# Patient Record
Sex: Male | Born: 1960 | Race: Black or African American | Hispanic: No | Marital: Single | Smoking: Never smoker
Health system: Southern US, Community
[De-identification: ages and names within clinical notes are randomized; demographics above are authoritative.]

## PROBLEM LIST (undated history)

## (undated) DIAGNOSIS — F428 Other obsessive-compulsive disorder: Secondary | ICD-10-CM

## (undated) DIAGNOSIS — F79 Unspecified intellectual disabilities: Secondary | ICD-10-CM

## (undated) DIAGNOSIS — L280 Lichen simplex chronicus: Secondary | ICD-10-CM

## (undated) DIAGNOSIS — E039 Hypothyroidism, unspecified: Secondary | ICD-10-CM

## (undated) DIAGNOSIS — E119 Type 2 diabetes mellitus without complications: Secondary | ICD-10-CM

## (undated) HISTORY — DX: Unspecified intellectual disabilities: F79

## (undated) HISTORY — DX: Type 2 diabetes mellitus without complications: E11.9

## (undated) HISTORY — DX: Hypothyroidism, unspecified: E03.9

## (undated) HISTORY — DX: Lichen simplex chronicus: L28.0

## (undated) HISTORY — DX: Other obsessive-compulsive disorder: F42.8

---

## 2002-05-20 HISTORY — PX: ESOPHAGOGASTRODUODENOSCOPY: SHX1529

## 2003-03-29 ENCOUNTER — Ambulatory Visit (HOSPITAL_COMMUNITY): Admission: RE | Admit: 2003-03-29 | Discharge: 2003-03-29 | Payer: Self-pay | Admitting: Internal Medicine

## 2003-04-01 ENCOUNTER — Ambulatory Visit (HOSPITAL_COMMUNITY): Admission: RE | Admit: 2003-04-01 | Discharge: 2003-04-01 | Payer: Self-pay | Admitting: Internal Medicine

## 2009-11-22 ENCOUNTER — Emergency Department (HOSPITAL_COMMUNITY)
Admission: EM | Admit: 2009-11-22 | Discharge: 2009-11-22 | Payer: Self-pay | Source: Home / Self Care | Admitting: Emergency Medicine

## 2010-03-12 ENCOUNTER — Emergency Department (HOSPITAL_COMMUNITY): Admission: EM | Admit: 2010-03-12 | Discharge: 2010-03-12 | Payer: Self-pay | Admitting: Emergency Medicine

## 2010-08-01 LAB — URINALYSIS, ROUTINE W REFLEX MICROSCOPIC
Bilirubin Urine: NEGATIVE
Glucose, UA: NEGATIVE mg/dL
Hgb urine dipstick: NEGATIVE
Nitrite: NEGATIVE
Protein, ur: NEGATIVE mg/dL
Specific Gravity, Urine: 1.021 (ref 1.005–1.030)
Urobilinogen, UA: 1 mg/dL (ref 0.0–1.0)
pH: 7.5 (ref 5.0–8.0)

## 2010-08-01 LAB — CBC
HCT: 36.8 % — ABNORMAL LOW (ref 39.0–52.0)
Hemoglobin: 12 g/dL — ABNORMAL LOW (ref 13.0–17.0)
MCH: 28.3 pg (ref 26.0–34.0)
MCHC: 32.5 g/dL (ref 30.0–36.0)
MCV: 87.1 fL (ref 78.0–100.0)
Platelets: 225 10*3/uL (ref 150–400)
RBC: 4.22 MIL/uL (ref 4.22–5.81)
RDW: 13.9 % (ref 11.5–15.5)
WBC: 8.3 10*3/uL (ref 4.0–10.5)

## 2010-08-01 LAB — POCT I-STAT, CHEM 8
BUN: 19 mg/dL (ref 6–23)
Calcium, Ion: 1.28 mmol/L (ref 1.12–1.32)
Chloride: 105 mEq/L (ref 96–112)
Creatinine, Ser: 1 mg/dL (ref 0.4–1.5)
Glucose, Bld: 92 mg/dL (ref 70–99)
HCT: 40 % (ref 39.0–52.0)
Hemoglobin: 13.6 g/dL (ref 13.0–17.0)
Potassium: 4.3 mEq/L (ref 3.5–5.1)
Sodium: 141 mEq/L (ref 135–145)
TCO2: 29 mmol/L (ref 0–100)

## 2010-08-01 LAB — DIFFERENTIAL
Basophils Absolute: 0 10*3/uL (ref 0.0–0.1)
Basophils Relative: 0 % (ref 0–1)
Eosinophils Absolute: 0 10*3/uL (ref 0.0–0.7)
Eosinophils Relative: 0 % (ref 0–5)
Lymphocytes Relative: 21 % (ref 12–46)
Lymphs Abs: 1.7 10*3/uL (ref 0.7–4.0)
Monocytes Absolute: 0.3 10*3/uL (ref 0.1–1.0)
Monocytes Relative: 4 % (ref 3–12)
Neutro Abs: 6.2 10*3/uL (ref 1.7–7.7)
Neutrophils Relative %: 75 % (ref 43–77)

## 2010-08-01 LAB — VALPROIC ACID LEVEL: Valproic Acid Lvl: 50 ug/mL (ref 50.0–100.0)

## 2010-08-01 LAB — AMMONIA: Ammonia: 19 umol/L (ref 11–35)

## 2010-10-05 NOTE — Op Note (Signed)
   NAME:  Timothy Bridges, Timothy Bridges                         ACCOUNT NO.:  [id number]   MEDICAL RECORD NO.:  [id number]                   PATIENT TYPE:  AMB   LOCATION:  DAY                                  FACILITY:  [facility]   PHYSICIAN:  R. Roetta Sessions, M.D.              DATE OF BIRTH:  [birth year]   DATE OF PROCEDURE:  03/29/2003  DATE OF DISCHARGE:                                 OPERATIVE REPORT   PROCEDURE:  Diagnostic esophagogastroduodenoscopy.   INDICATIONS FOR PROCEDURE:  The patient is a 49 year old gentleman, mentally  challenged, with intermittent regurgitation, possible rumination, with no  improvement with Prilosec.  EGD is now being done to further evaluate his  symptoms.  This approach has been discussed with the patient at length.  The  potential risks, benefits, and alternatives have been reviewed and questions  answered.  Please see my 03/07/2003 consultation note for more information.   PROCEDURE:  O2 saturation, blood pressure, pulses, and respirations were  monitored throughout the entire procedure.  Conscious sedation was with  Versed 5 mg IV, Demerol 100 mg IV in divided doses, Cetacaine spray for  topical oropharyngeal anesthesia.  The instrument used was the Olympus video  chip adult gastroscope.   FINDINGS:  Examination of the tubular esophagus revealed no mucosal  abnormalities.  The EG junction was easily traversed.   Stomach:  The gastric cavity was empty and insufflated well with air.  Thorough examination of the gastric mucosa including retroflex view in the  proximal stomach and esophagogastric junction demonstrated no abnormalities.  The pylorus was patent and easily traversed.   Duodenum:  The bulb and second portion appeared normal.   THERAPEUTIC/DIAGNOSTIC MANEUVERS PERFORMED:  None.   The patient tolerated the procedure well and was reactive in endoscopy.   IMPRESSION:  Normal esophagus, stomach, and first and second portions of the  duodenum.   RECOMMENDATIONS:  1. Will go ahead and proceed with a solid phase nuclear gastric imaging     study given his history of diabetes to rule     out delayed gastric emptying.  2. He is to continue Prilosec 20 mg orally daily for the time being.  3. Further recommendations to follow.      ___________________________________________                                            [name], M.D.   RMR/MEDQ  D:  03/29/2003  T:  03/29/2003  Job:  [id number]   cc:   Wyvonnia Lora  [address]  [place]  [place] [address]  Fax: [phone]

## 2010-10-05 NOTE — Consult Note (Signed)
NAME:  Timothy Bridges, Timothy Bridges NO.:  [id number]   MEDICAL RECORD NO.:  [id number]                  PATIENT TYPE:   LOCATION:                                       FACILITY:   PHYSICIAN:  R. Roetta Sessions, M.D.              DATE OF BIRTH:  [birth year]   DATE OF CONSULTATION:  03/07/2003  DATE OF DISCHARGE:                                   CONSULTATION   PRIMARY CARE PHYSICIAN:  Dr. Wyvonnia Lora in [place], [place].   REASON FOR CONSULTATION:  Nausea and vomiting, weight loss.   HISTORY OF PRESENT ILLNESS:  Timothy Bridges is a 50 year old African-  American male, per family mentally challenged, resident of a group home sent  over by Dr. Margo Common to further evaluate a three-year history of intermittent  nausea and vomiting.  According to his caregiver he typically eats a meal  and comes away from the table, and then some several minutes afterward  starts bringing up his food effortlessly.  He has not really complained  about any obvious typical reflux symptoms, has not had any abdominal pain,  and bowel function maintained at one to two formed bowel movements a day  with no melena, no rectal bleeding, no constipation or diarrhea noted.  He  has lost approximately 20 pounds in the past year.  He does not display this  behavior all the time; however, it is fairly routine and seen on a regular  basis and has been present for the past three years.  Apparently he was on a  course of Reglan briefly which did not help.  He has been started on  Prilosec 20 mg orally daily which seems to have diminished some of these  episodes.  Again, there is no heaving.  This sounds like effortless  regurgitation.  He has not had any apparent esophageal dysphagia or  odynophagia.  He has not had any radiographic or endoscopy studies.   PAST MEDICAL HISTORY:  As above.  History of type 2 diabetes mellitus,  schizoaffective disorder, mental retardation.   PAST SURGERY:   None.   CURRENT MEDICATIONS:  1. Levothyroxine 0.05 mg daily.  2. Depakote 250 mg b.i.d.  3. Prilosec 20 mg daily.  4. Zyprexa 10 mg b.i.d.  5. __________ cream p.r.n.  6. __________ 25 mg daily.  7. HCL 25 mg b.i.d.   ALLERGIES:  No known drug allergies.   FAMILY HISTORY:  Mother is deceased.  Father deceased; he had kidney stones.  No history of GI or liver illness.   SOCIAL HISTORY:  The patient is single, no children.  No tobacco, no  alcohol.  Group home resident.   PHYSICAL EXAMINATION:  GENERAL:  Reveals a 50 year old gentleman accompanied  by his caregiver who has quite a bit of rhythmic arm movements.  VITAL SIGNS:  Weight 164, temperature 98.7, blood pressure 110/78, pulse 76.  SKIN:  Warm  and dry.  HEENT:  No sclerae icterus.  Conjunctivae are pink.  JVD is not prominent.  CHEST:  Lungs are clear to auscultation.  CARDIAC:  Regular rate and rhythm without murmur, gallop, or rub.  ABDOMEN:  Nondistended, positive bowel sounds, soft, nontender, without  appreciable mass or organomegaly.   ASSESSMENT:  Timothy Bridges is a 50 year old gentleman who is mentally  challenged; it is difficulty to track the history.  According to the  caregiver he is displaying regurgitation and possibly rumination syndrome.  I really do not get a history of frank nausea and vomiting.  He may have  significant gastroesophageal reflux disease.  I do get a sense that a course  of acid suppression therapy with Prilosec did ameliorate some of his  symptoms.  He has lost weight but that apparently has stabilized.  He really  needs to have imaging of his upper GI tract.  I have offered Mr. Timothy Bridges an  EGD.  I have discussed this with his caregiver.  Hopefully we can do this  under conscious sedation.  Risks, benefits, and alternatives have been  reviewed.  Will plan EGD in the near future at [facility].  Further  recommendations to follow.   I would like to thank Dr. Wyvonnia Lora for  allowing me to see this nice  gentleman today.      ___________________________________________                                            [name], M.D.   RMR/MEDQ  D:  03/07/2003  T:  03/07/2003  Job:  [id number]   cc:   Wyvonnia Lora  [address]  [place]  [place] [address]  Fax: [phone]

## 2011-01-16 ENCOUNTER — Ambulatory Visit (INDEPENDENT_AMBULATORY_CARE_PROVIDER_SITE_OTHER): Payer: Medicare Other | Admitting: Gastroenterology

## 2011-01-16 ENCOUNTER — Encounter: Payer: Self-pay | Admitting: Gastroenterology

## 2011-01-16 VITALS — BP 105/68 | HR 91 | Temp 97.2°F | Ht 71.0 in | Wt 146.0 lb

## 2011-01-16 DIAGNOSIS — R1110 Vomiting, unspecified: Secondary | ICD-10-CM

## 2011-01-16 DIAGNOSIS — IMO0001 Reserved for inherently not codable concepts without codable children: Secondary | ICD-10-CM | POA: Insufficient documentation

## 2011-01-16 DIAGNOSIS — F428 Other obsessive-compulsive disorder: Secondary | ICD-10-CM | POA: Insufficient documentation

## 2011-01-16 DIAGNOSIS — F9821 Rumination disorder of infancy: Secondary | ICD-10-CM

## 2011-01-16 DIAGNOSIS — R634 Abnormal weight loss: Secondary | ICD-10-CM | POA: Insufficient documentation
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
## 2011-01-18 ENCOUNTER — Encounter: Payer: Self-pay | Admitting: Gastroenterology

## 2011-01-18 NOTE — Assessment & Plan Note (Signed)
Abnormal weight loss. History of psychogenic rumination syndrome. According to group home staff, episodes have become more frequent and persistent. No symptoms of abdominal pain, diarrhea, blood in the stool. He has mild normocytic anemia as well. Difficult to evaluate the patient given his mental retardation, psychiatric issues.  Recommend colonoscopy given the age, weight loss, and anemia. Also recommend EGD for ongoing regurgitation and weight loss to rule out structural abnormality.  According to group home staff, patient has healthcare power of attorney, cousin, who will provide consent.

## 2011-01-22 LAB — HEMOGLOBIN A1C
ALT: 12 U/L (ref 10–40)
AST: 14 U/L
Albumin: 3.7
Alkaline Phosphatase: 42 U/L
BUN: 15 mg/dL (ref 4–21)
Calcium: 9.8 mg/dL (ref 8.7–10.7)
Creat: 0.83
Glucose: 84
HCT: 36 %
Hemoglobin: 11.6 g/dL — AB (ref 13.5–17.5)
Hgb A1c MFr Bld: 5.6 % (ref 4.0–6.0)
MCV: 88.5 fL
Potassium: 3.9 mmol/L
Sodium: 139 mmol/L (ref 137–147)
TSH: 0.63
Total bilirubin, fluid: 0.3
Valproic Acid Lvl: 45
platelet count: 197

## 2011-01-22 NOTE — Progress Notes (Signed)
Cc to PCP 

## 2011-01-25 ENCOUNTER — Encounter (HOSPITAL_COMMUNITY)
Admission: RE | Admit: 2011-01-25 | Discharge: 2011-01-25 | Disposition: A | Discharge status: Home / Self Care | Payer: Medicaid Other | Source: Ambulatory Visit | Attending: Internal Medicine | Admitting: Internal Medicine

## 2011-01-25 NOTE — Progress Notes (Signed)
Pt. Arrived for pre-op interview.  Pt. Is non-verbal & arrived with aide from group home.  Aide stated that the guardian/POA was not going to be present during interview process or the day of procedure.  I called [facility] & spoke with coordinator Sharalyn Ink & she stated that they didn't have legal papers stating that the pt.'s cousin was the POA.  The coordinator faxed over papers stating that they could sign in her place but the papers are not legal.  Dr. Luvenia Starch office notified of situation.  Spoke with Crystal & stated that either the POA had to be present or legal papers were needed stating guardianship.  Tried to call POA Ampsey Hyacinth Meeker & was unable to contact her.

## 2011-01-25 NOTE — Patient Instructions (Signed)
21 Timothy Bridges  01/25/2011   Your procedure is scheduled on:  01/31/11  [facility] to [facility] [place] [facility].  Call this number if you have problems the morning of surgery: [phone]   Remember:   Do not eat food:After Midnight.  Do not drink clear liquids: After Midnight.  Take these medicines the morning of surgery with A SIP OF WATER: depakote, vistaril, synthroid, reglan, & prilosec.   Do not wear jewelry, make-up or nail polish.  Do not wear lotions, powders, or perfumes. You may wear deodorant.  Do not shave 48 hours prior to surgery.  Do not bring valuables to the hospital.  Contacts, dentures or bridgework may not be worn into surgery.  Leave suitcase in the car. After surgery it may be brought to your room.  For patients admitted to the hospital, checkout time is 11:00 AM the day of discharge.   Patients discharged the day of surgery will not be allowed to drive home.  Name and phone number of your driver: family  Special Instructions: N/A   Please read over the following fact sheets that you were given: Pain Booklet, Anesthesia Post-op Instructions and Care and Recovery After Surgery   PATIENT INSTRUCTIONS POST-ANESTHESIA  IMMEDIATELY FOLLOWING SURGERY:  Do not drive or operate machinery for the first twenty four hours after surgery.  Do not make any important decisions for twenty four hours after surgery or while taking narcotic pain medications or sedatives.  If you develop intractable nausea and vomiting or a severe headache please notify your doctor immediately.  FOLLOW-UP:  Please make an appointment with your surgeon as instructed. You do not need to follow up with anesthesia unless specifically instructed to do so.  WOUND CARE INSTRUCTIONS (if applicable):  Keep a dry clean dressing on the anesthesia/puncture wound site if there is drainage.  Once the wound has quit draining you may leave it open to air.  Generally you should leave the bandage intact for twenty four  hours unless there is drainage.  If the epidural site drains for more than 36-48 hours please call the anesthesia department.  QUESTIONS?:  Please feel free to call your physician or the hospital operator if you have any questions, and they will be happy to assist you.     [facility] Anesthesia Department [address] [place] [phone]

## 2011-01-31 ENCOUNTER — Encounter (HOSPITAL_COMMUNITY): Admission: RE | Payer: Self-pay | Source: Ambulatory Visit

## 2011-01-31 ENCOUNTER — Ambulatory Visit (HOSPITAL_COMMUNITY): Admission: RE | Admit: 2011-01-31 | Payer: Medicaid Other | Source: Ambulatory Visit | Admitting: Internal Medicine

## 2011-01-31 ENCOUNTER — Encounter (HOSPITAL_COMMUNITY): Payer: Self-pay | Admitting: Anesthesiology

## 2011-01-31 SURGERY — COLONOSCOPY WITH PROPOFOL
Anesthesia: Monitor Anesthesia Care

## 2011-01-31 NOTE — Anesthesia Preprocedure Evaluation (Deleted)
Anesthesia Evaluation Anesthesia Physical Anesthesia Plan Anesthesia Quick Evaluation  

## 2011-02-28 ENCOUNTER — Ambulatory Visit (INDEPENDENT_AMBULATORY_CARE_PROVIDER_SITE_OTHER): Payer: Medicaid Other | Admitting: Gastroenterology

## 2011-02-28 ENCOUNTER — Encounter: Payer: Self-pay | Admitting: Gastroenterology

## 2011-02-28 VITALS — BP 115/81 | HR 94 | Temp 97.6°F | Ht 72.0 in | Wt 150.8 lb

## 2011-02-28 DIAGNOSIS — R634 Abnormal weight loss: Secondary | ICD-10-CM

## 2011-02-28 DIAGNOSIS — F9821 Rumination disorder of infancy: Secondary | ICD-10-CM

## 2011-02-28 DIAGNOSIS — F428 Other obsessive-compulsive disorder: Secondary | ICD-10-CM

## 2011-02-28 DIAGNOSIS — R1110 Vomiting, unspecified: Secondary | ICD-10-CM
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
## 2011-02-28 NOTE — Patient Instructions (Signed)
We have set you up for a look at your upper and lower GI tract.  It is very important that your power of attorney be present for this procedure.

## 2011-03-04 NOTE — Assessment & Plan Note (Signed)
50 year old male, mentally-challenged, with abnormal wt loss documented in the setting of psychogenic rumination syndrome and mild normocytic anemia. No other symptoms such as abdominal pain, change in bowel habits, or rectal bleeding. Due to pt's mental status, may be difficult to fully appreciate clinical picture. Due to wt loss, anemia, will proceed with colonoscopy as well as EGD for continued regurgitation/wt loss.   Pt's cousin is the power of attorney, who was not present at time of last scheduled procedure a few weeks ago. This had to be cancelled. I discussed with the aide today the importance of scheduling this in conjuction with the POA's schedule.   Will be done in the OR due to polypharmacy.   Proceed with EGD and TCS with Dr. Jena Gauss in near future: the risks, benefits, and alternatives have been discussed with the patient in detail. The patient states understanding and desires to proceed.

## 2011-03-06 NOTE — Progress Notes (Signed)
Cc to PCP 

## 2011-03-21 ENCOUNTER — Inpatient Hospital Stay (HOSPITAL_COMMUNITY): Admission: RE | Admit: 2011-03-21 | Payer: Medicaid Other | Source: Ambulatory Visit

## 2011-03-22 ENCOUNTER — Encounter (HOSPITAL_COMMUNITY): Payer: Self-pay | Admitting: Pharmacy Technician

## 2011-03-22 ENCOUNTER — Encounter (HOSPITAL_COMMUNITY): Payer: Self-pay

## 2011-03-22 ENCOUNTER — Encounter (HOSPITAL_COMMUNITY)
Admission: RE | Admit: 2011-03-22 | Discharge: 2011-03-22 | Disposition: A | Discharge status: Home / Self Care | Payer: Medicare Other | Source: Ambulatory Visit | Attending: Internal Medicine | Admitting: Internal Medicine

## 2011-03-22 ENCOUNTER — Other Ambulatory Visit: Payer: Self-pay

## 2011-03-22 LAB — CBC
HCT: 37.9 % — ABNORMAL LOW (ref 39.0–52.0)
MCHC: 32.2 g/dL (ref 30.0–36.0)
Platelets: 234 10*3/uL (ref 150–400)
RDW: 13.7 % (ref 11.5–15.5)
WBC: 4.7 10*3/uL (ref 4.0–10.5)

## 2011-03-22 LAB — BASIC METABOLIC PANEL
BUN: 15 mg/dL (ref 6–23)
Creatinine, Ser: 0.94 mg/dL (ref 0.50–1.35)
GFR calc Af Amer: >90 mL/min (ref >90)
GFR calc non Af Amer: >90 mL/min (ref >90)
Potassium: 4.3 mEq/L (ref 3.5–5.1)

## 2011-03-22 NOTE — Patient Instructions (Addendum)
21 Timothy Bridges  03/22/2011   Your procedure is scheduled on:  03/28/2011  Report to [facility] at  900  AM.  Call this number if you have problems the morning of surgery: [phone]   Remember:   Do not eat food:After Midnight.  Do not drink clear liquids: After Midnight.  Take these medicines the morning of surgery with A SIP OF WATER:  Depakote,vistaril,synthroid,reglan,prilosec   Do not wear jewelry, make-up or nail polish.  Do not wear lotions, powders, or perfumes. You may wear deodorant.  Do not shave 48 hours prior to surgery.  Do not bring valuables to the hospital.  Contacts, dentures or bridgework may not be worn into surgery.  Leave suitcase in the car. After surgery it may be brought to your room.  For patients admitted to the hospital, checkout time is 11:00 AM the day of discharge.   Patients discharged the day of surgery will not be allowed to drive home.  Name and phone number of your driver: Group home staff  Special Instructions: N/A   Please read over the following fact sheets that you were given: Pain Booklet, Surgical Site Infection Prevention, Anesthesia Post-op Instructions and Care and Recovery After Surgery Colonoscopy A colonoscopy is an exam to evaluate your entire colon. In this exam, your colon is cleansed. A long fiberoptic tube is inserted through your rectum and into your colon. The fiberoptic scope (endoscope) is a long bundle of enclosed and very flexible fibers. These fibers transmit light to the area examined and send images from that area to your caregiver. Discomfort is usually minimal. You may be given a drug to help you sleep (sedative) during or prior to the procedure. This exam helps to detect lumps (tumors), polyps, inflammation, and areas of bleeding. Your caregiver may also take a small piece of tissue (biopsy) that will be examined under a microscope. LET YOUR CAREGIVER KNOW ABOUT:   Allergies to food or medicine.   Medicines taken,  including vitamins, herbs, eyedrops, over-the-counter medicines, and creams.   Use of steroids (by mouth or creams).   Previous problems with anesthetics or numbing medicines.   History of bleeding problems or blood clots.   Previous surgery.   Other health problems, including diabetes and kidney problems.   Possibility of pregnancy, if this applies.  BEFORE THE PROCEDURE   A clear liquid diet may be required for 2 days before the exam.   Ask your caregiver about changing or stopping your regular medications.   Liquid injections (enemas) or laxatives may be required.   A large amount of electrolyte solution may be given to you to drink over a short period of time. This solution is used to clean out your colon.   You should be present 60 minutes prior to your procedure or as directed by your caregiver.  AFTER THE PROCEDURE   If you received a sedative or pain relieving medication, you will need to arrange for someone to drive you home.   Occasionally, there is a little blood passed with the first bowel movement. Do not be concerned.  FINDING OUT THE RESULTS OF YOUR TEST Not all test results are available during your visit. If your test results are not back during the visit, make an appointment with your caregiver to find out the results. Do not assume everything is normal if you have not heard from your caregiver or the medical facility. It is important for you to follow up on all of your test  results. HOME CARE INSTRUCTIONS   It is not unusual to pass moderate amounts of gas and experience mild abdominal cramping following the procedure. This is due to air being used to inflate your colon during the exam. Walking or a warm pack on your belly (abdomen) may help.   You may resume all normal meals and activities after sedatives and medicines have worn off.   Only take over-the-counter or prescription medicines for pain, discomfort, or fever as directed by your caregiver. Do not use  aspirin or blood thinners if a biopsy was taken. Consult your caregiver for medicine usage if biopsies were taken.  SEEK IMMEDIATE MEDICAL CARE IF:   You have a fever.   You pass large blood clots or fill a toilet with blood following the procedure. This may also occur 10 to 14 days following the procedure. This is more likely if a biopsy was taken.   You develop abdominal pain that keeps getting worse and cannot be relieved with medicine.  Document Released: 05/03/2000 Document Revised: 01/16/2011 Document Reviewed: 12/17/2007 [facility] Patient Information 2012 [place], [place].Esophagogastroduodenoscopy This is an endoscopic procedure (a procedure that uses a device like a flexible telescope) that allows your caregiver to view the upper stomach and small bowel. This test allows your caregiver to look at the esophagus. The esophagus carries food from your mouth to your stomach. They can also look at your duodenum. This is the first part of the small intestine that attaches to the stomach. This test is used to detect problems in the bowel such as ulcers and inflammation. PREPARATION FOR TEST Nothing to eat after midnight the day before the test. NORMAL FINDINGS Normal esophagus, stomach, and duodenum. Ranges for normal findings may vary among different laboratories and hospitals. You should always check with your doctor after having lab work or other tests done to discuss the meaning of your test results and whether your values are considered within normal limits. MEANING OF TEST  Your caregiver will go over the test results with you and discuss the importance and meaning of your results, as well as treatment options and the need for additional tests if necessary. OBTAINING THE TEST RESULTS It is your responsibility to obtain your test results. Ask the lab or department performing the test when and how you will get your results. Document Released: 09/06/2004 Document Revised: 01/16/2011 Document  Reviewed: 04/15/2008 [facility] Patient Information 2012 [place], [place].PATIENT INSTRUCTIONS POST-ANESTHESIA  IMMEDIATELY FOLLOWING SURGERY:  Do not drive or operate machinery for the first twenty four hours after surgery.  Do not make any important decisions for twenty four hours after surgery or while taking narcotic pain medications or sedatives.  If you develop intractable nausea and vomiting or a severe headache please notify your doctor immediately.  FOLLOW-UP:  Please make an appointment with your surgeon as instructed. You do not need to follow up with anesthesia unless specifically instructed to do so.  WOUND CARE INSTRUCTIONS (if applicable):  Keep a dry clean dressing on the anesthesia/puncture wound site if there is drainage.  Once the wound has quit draining you may leave it open to air.  Generally you should leave the bandage intact for twenty four hours unless there is drainage.  If the epidural site drains for more than 36-48 hours please call the anesthesia department.  QUESTIONS?:  Please feel free to call your physician or the hospital operator if you have any questions, and they will be happy to assist you.     [facility] Anesthesia Department [address] [place] [phone]

## 2011-03-25 ENCOUNTER — Telehealth: Payer: Self-pay

## 2011-03-25 MED ORDER — BISACODYL 5 MG PO TBEC: DELAYED_RELEASE_TABLET | ORAL | Status: AC

## 2011-03-25 MED ORDER — POLYETHYLENE GLYCOL 3350 17 GM/SCOOP PO POWD: ORAL | Status: DC

## 2011-03-25 NOTE — Telephone Encounter (Signed)
Alisa from group home called- pt will not drink the tri-lyte prep. Asked her if it was the taste or the amount that he had to drink. She said it was the taste, they have no problems getting him to drink. Spoke with AS- ok to try the miralax prep. They will need rx sent to [facility] pharmacy on [address]/ [place]. They will also need new instructions faxed to [phone].

## 2011-03-25 NOTE — Progress Notes (Signed)
Quick Note:  Scheduled for procedure. ______

## 2011-03-25 NOTE — Telephone Encounter (Signed)
Miralax and Dulcolax prescriptions printed out. Need to be faxed.

## 2011-03-26 ENCOUNTER — Encounter: Payer: Self-pay | Admitting: Gastroenterology

## 2011-03-28 ENCOUNTER — Encounter (HOSPITAL_COMMUNITY): Payer: Self-pay | Admitting: *Deleted

## 2011-03-28 ENCOUNTER — Encounter (HOSPITAL_COMMUNITY)
Admission: RE | Disposition: A | Discharge status: Home / Self Care | Payer: Self-pay | Source: Ambulatory Visit | Attending: Internal Medicine

## 2011-03-28 ENCOUNTER — Encounter (HOSPITAL_COMMUNITY): Payer: Self-pay | Admitting: Anesthesiology

## 2011-03-28 ENCOUNTER — Ambulatory Visit (HOSPITAL_COMMUNITY)
Admission: RE | Admit: 2011-03-28 | Discharge: 2011-03-28 | Disposition: A | Discharge status: Home / Self Care | Payer: Medicare Other | Source: Ambulatory Visit | Attending: Internal Medicine | Admitting: Internal Medicine

## 2011-03-28 DIAGNOSIS — R634 Abnormal weight loss: Secondary | ICD-10-CM

## 2011-03-28 DIAGNOSIS — Z1211 Encounter for screening for malignant neoplasm of colon: Secondary | ICD-10-CM | POA: Insufficient documentation

## 2011-03-28 DIAGNOSIS — E119 Type 2 diabetes mellitus without complications: Secondary | ICD-10-CM | POA: Insufficient documentation

## 2011-03-28 DIAGNOSIS — Z01812 Encounter for preprocedural laboratory examination: Secondary | ICD-10-CM | POA: Insufficient documentation

## 2011-03-28 SURGERY — COLONOSCOPY WITH PROPOFOL
Anesthesia: Monitor Anesthesia Care | Site: Throat

## 2011-03-28 MED ORDER — MORPHINE SULFATE 10 MG/ML IJ SOLN
INTRAMUSCULAR | Status: DC | PRN
  Administered 2011-03-28 (×2): 2 mg via INTRAVENOUS

## 2011-03-28 MED ORDER — MIDAZOLAM HCL 2 MG/2ML IJ SOLN
INTRAMUSCULAR | Status: AC
  Administered 2011-03-28: 2 mg via INTRAVENOUS
  Filled 2011-03-28: qty 2

## 2011-03-28 MED ORDER — GLYCOPYRROLATE 0.2 MG/ML IJ SOLN
0.2000 mg | Freq: Once | INTRAMUSCULAR | Status: AC
  Administered 2011-03-28: 0.2 mg via INTRAVENOUS

## 2011-03-28 MED ORDER — LIDOCAINE HCL (PF) 1 % IJ SOLN
INTRAMUSCULAR | Status: AC
  Filled 2011-03-28: qty 5

## 2011-03-28 MED ORDER — ONDANSETRON HCL 4 MG/2ML IJ SOLN
INTRAMUSCULAR | Status: AC
  Administered 2011-03-28: 4 mg via INTRAVENOUS
  Filled 2011-03-28: qty 2

## 2011-03-28 MED ORDER — DIPHENHYDRAMINE HCL 50 MG/ML IJ SOLN
25.0000 mg | Freq: Once | INTRAMUSCULAR | Status: AC
  Administered 2011-03-28: 25 mg via INTRAVENOUS

## 2011-03-28 MED ORDER — MORPHINE SULFATE 10 MG/ML IJ SOLN
INTRAMUSCULAR | Status: AC
  Filled 2011-03-28: qty 1

## 2011-03-28 MED ORDER — PROPOFOL 10 MG/ML IV EMUL
INTRAVENOUS | Status: DC | PRN
  Administered 2011-03-28 (×2): 20 mg via INTRAVENOUS
  Administered 2011-03-28: 10 mg via INTRAVENOUS

## 2011-03-28 MED ORDER — ONDANSETRON HCL 4 MG/2ML IJ SOLN
4.0000 mg | Freq: Once | INTRAMUSCULAR | Status: AC
  Administered 2011-03-28: 4 mg via INTRAVENOUS

## 2011-03-28 MED ORDER — MIDAZOLAM HCL 2 MG/2ML IJ SOLN
INTRAMUSCULAR | Status: AC
  Administered 2011-03-28: 4 mg via INTRAVENOUS
  Filled 2011-03-28: qty 4

## 2011-03-28 MED ORDER — MIDAZOLAM HCL 2 MG/2ML IJ SOLN
1.0000 mg | INTRAMUSCULAR | Status: DC | PRN
  Administered 2011-03-28: 4 mg via INTRAVENOUS

## 2011-03-28 MED ORDER — MIDAZOLAM HCL 2 MG/2ML IJ SOLN
1.0000 mg | INTRAMUSCULAR | Status: AC | PRN
  Administered 2011-03-28 (×3): 2 mg via INTRAVENOUS

## 2011-03-28 MED ORDER — GLYCOPYRROLATE 0.2 MG/ML IJ SOLN
INTRAMUSCULAR | Status: AC
  Administered 2011-03-28: 0.2 mg via INTRAVENOUS
  Filled 2011-03-28: qty 1

## 2011-03-28 MED ORDER — PROPOFOL 10 MG/ML IV EMUL
INTRAVENOUS | Status: AC
  Filled 2011-03-28: qty 20

## 2011-03-28 MED ORDER — MIDAZOLAM HCL 5 MG/5ML IJ SOLN
INTRAMUSCULAR | Status: DC | PRN
  Administered 2011-03-28: 1 mg via INTRAVENOUS

## 2011-03-28 MED ORDER — MIDAZOLAM HCL 5 MG/5ML IJ SOLN
INTRAMUSCULAR | Status: AC
  Administered 2011-03-28: 2 mg via INTRAVENOUS
  Filled 2011-03-28: qty 5

## 2011-03-28 MED ORDER — STERILE WATER FOR IRRIGATION IR SOLN
Status: DC | PRN
  Administered 2011-03-28: 11:00:00

## 2011-03-28 MED ORDER — LACTATED RINGERS IV SOLN
INTRAVENOUS | Status: DC
  Administered 2011-03-28: 1000 mL via INTRAVENOUS
  Administered 2011-03-28: 12:00:00 via INTRAVENOUS

## 2011-03-28 MED ORDER — FENTANYL CITRATE 0.05 MG/ML IJ SOLN: INTRAMUSCULAR | Status: DC | PRN

## 2011-03-28 MED ORDER — BUTAMBEN-TETRACAINE-BENZOCAINE 2-2-14 % EX AERO
1.0000 | INHALATION_SPRAY | Freq: Once | CUTANEOUS | Status: DC
  Filled 2011-03-28: qty 56

## 2011-03-28 MED ORDER — PROPOFOL 10 MG/ML IV EMUL
INTRAVENOUS | Status: DC | PRN
  Administered 2011-03-28 (×2): 75 ug/kg/min via INTRAVENOUS

## 2011-03-28 MED ORDER — DIPHENHYDRAMINE HCL 50 MG/ML IJ SOLN
INTRAMUSCULAR | Status: AC
  Administered 2011-03-28: 25 mg
  Filled 2011-03-28: qty 1

## 2011-03-28 SURGICAL SUPPLY — 26 items
BLOCK BITE 60FR ADLT L/F BLUE (MISCELLANEOUS) ×3 IMPLANT
DEVICE CLIP HEMOSTAT 235CM (CLIP) IMPLANT
ELECT REM PT RETURN 9FT ADLT (ELECTROSURGICAL)
ELECTRODE REM PT RTRN 9FT ADLT (ELECTROSURGICAL) IMPLANT
FCP BXJMBJMB 240X2.8X (CUTTING FORCEPS)
FLOOR PAD 36X40 (MISCELLANEOUS) ×3
FORCEP RJ3 GP 1.8X160 W-NEEDLE (CUTTING FORCEPS) ×2 IMPLANT
FORCEPS BIOP RAD 4 LRG CAP 4 (CUTTING FORCEPS) ×2 IMPLANT
FORCEPS BIOP RJ4 240 W/NDL (CUTTING FORCEPS)
FORCEPS BXJMBJMB 240X2.8X (CUTTING FORCEPS) IMPLANT
INJECTOR/SNARE I SNARE (MISCELLANEOUS) IMPLANT
LUBRICANT JELLY 4.5OZ STERILE (MISCELLANEOUS) ×1 IMPLANT
MANIFOLD NEPTUNE II (INSTRUMENTS) ×1 IMPLANT
MANIFOLD NEPTUNE WASTE (CANNULA) ×3 IMPLANT
NDL SCLEROTHERAPY 25GX240 (NEEDLE) ×2 IMPLANT
NEEDLE SCLEROTHERAPY 25GX240 (NEEDLE) IMPLANT
PAD FLOOR 36X40 (MISCELLANEOUS) ×2 IMPLANT
PROBE APC STR FIRE (PROBE) ×2 IMPLANT
PROBE INJECTION GOLD (MISCELLANEOUS)
PROBE INJECTION GOLD 7FR (MISCELLANEOUS) ×2 IMPLANT
SNARE ROTATE MED OVAL 20MM (MISCELLANEOUS) ×2 IMPLANT
SYR 50ML LL SCALE MARK (SYRINGE) ×1 IMPLANT
TRAP SPECIMEN MUCOUS 40CC (MISCELLANEOUS) IMPLANT
TUBING ENDO SMARTCAP PENTAX (MISCELLANEOUS) ×3 IMPLANT
TUBING IRRIGATION ENDOGATOR (MISCELLANEOUS) ×3 IMPLANT
WATER STERILE IRR 1000ML POUR (IV SOLUTION) ×1 IMPLANT

## 2011-03-28 NOTE — Anesthesia Preprocedure Evaluation (Addendum)
Anesthesia Evaluation  Patient identified by MRN, date of birth, ID band Patient confused    Reviewed: Allergy & Precautions, H&P , NPO status , Patient's Chart, lab work & pertinent test results, Unable to perform ROS - Chart review only  Airway Mallampati: II TM Distance: >3 FB Neck ROM: Full    Dental  (+) Edentulous Upper and Edentulous Lower   Pulmonary    Pulmonary exam normal       Cardiovascular neg cardio ROS Regular Normal    Neuro/Psych PSYCHIATRIC DISORDERS (mental retardation -schizoaffective disorder)    GI/Hepatic   Endo/Other  Diabetes mellitus-, Type 2Hypothyroidism   Renal/GU      Musculoskeletal   Abdominal   Peds  Hematology   Anesthesia Other Findings   Reproductive/Obstetrics                           Anesthesia Physical Anesthesia Plan  ASA: III  Anesthesia Plan: MAC   Post-op Pain Management:    Induction:   Airway Management Planned: Simple Face Mask  Additional Equipment:   Intra-op Plan:   Post-operative Plan:   Informed Consent: I have reviewed the patients History and Physical, chart, labs and discussed the procedure including the risks, benefits and alternatives for the proposed anesthesia with the patient or authorized representative who has indicated his/her understanding and acceptance.     Plan Discussed with:   Anesthesia Plan Comments:         Anesthesia Quick Evaluation

## 2011-03-28 NOTE — Transfer of Care (Signed)
Immediate Anesthesia Transfer of Care Note  Patient: Timothy Bridges  Procedure(s) Performed:  COLONOSCOPY WITH PROPOFOL - procedure started at 1118; in cecum @  1156;  total cecal withdrawal time-8 minutes; ESOPHAGOGASTRODUODENOSCOPY (EGD) WITH PROPOFOL - procedure ended at 1114  Patient Location: PACU  Anesthesia Type: MAC  Level of Consciousness: sedated  Airway & Oxygen Therapy: Patient Spontanous Breathing and Patient connected to face mask oxygen  Post-op Assessment: Report given to PACU RN  Post vital signs: Reviewed and stable  Complications: No apparent anesthesia complications

## 2011-03-28 NOTE — Progress Notes (Signed)
Pt is very agitated, combative and uncooperative. Pt reassured and staff and care giver to offer support and safety relief to pt.

## 2011-03-28 NOTE — H&P (Signed)
  I have seen & examined the patient prior to the procedure(s) today and reviewed the history and physical/consultation.  According to caregiver, Irish Lack, pt doing much better recently - eats everything in sight ; gained 3 pounds in past weekThere have been no changes.  After consideration of the risks, benefits, alternatives and imponderables, the patient has consented to the procedure(s).

## 2011-03-28 NOTE — Anesthesia Postprocedure Evaluation (Signed)
  Anesthesia Post-op Note  Patient: Timothy Bridges  Procedure(s) Performed:  COLONOSCOPY WITH PROPOFOL - procedure started at 1118; in cecum @  1156;  total cecal withdrawal time-8 minutes; ESOPHAGOGASTRODUODENOSCOPY (EGD) WITH PROPOFOL - procedure ended at 1114  Patient Location: PACU  Anesthesia Type: MAC  Level of Consciousness: sedated  Airway and Oxygen Therapy: Patient Spontanous Breathing  Post-op Pain: none  Post-op Assessment: Post-op Vital signs reviewed  Post-op Vital Signs: Reviewed and stable  Complications: No apparent anesthesia complications

## 2011-07-16 ENCOUNTER — Other Ambulatory Visit: Payer: Self-pay

## 2011-07-16 MED ORDER — POLYETHYLENE GLYCOL 3350 17 GM/SCOOP PO POWD: 17.0000 g | Freq: Every day | ORAL | Status: DC | PRN

## 2011-07-16 NOTE — Telephone Encounter (Signed)
Please let patient's family know, if needs miralax for constipation, can take daily but note difference in directions as opposed to bowel prep.

## 2011-10-29 IMAGING — CR DG CHEST 2V
2 series · 2 of 2 positions shown · non-contrast
Comparison: None.

CLINICAL DATA: Altered mental status

CHEST - 2 VIEW

[w chest lat]
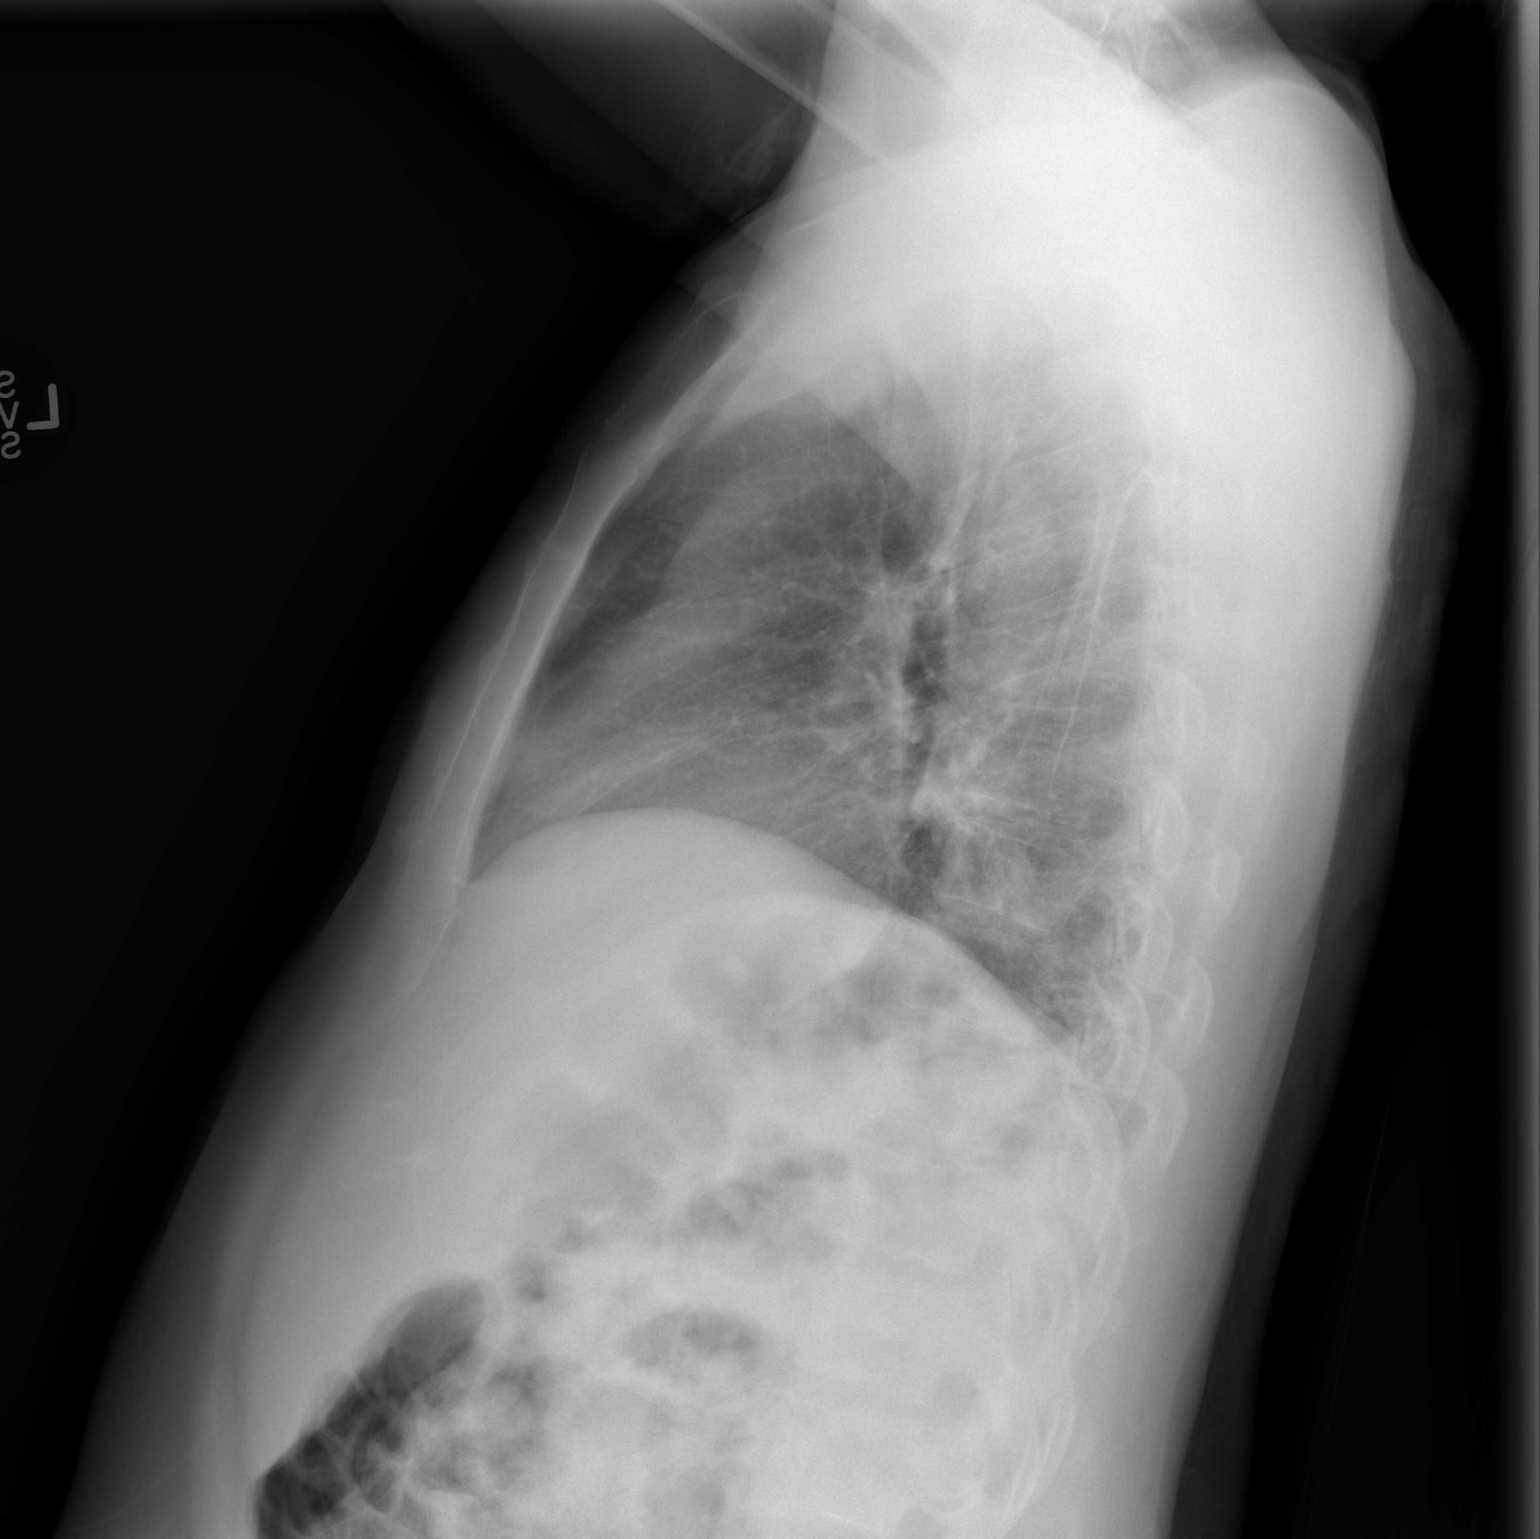

[view not recorded]
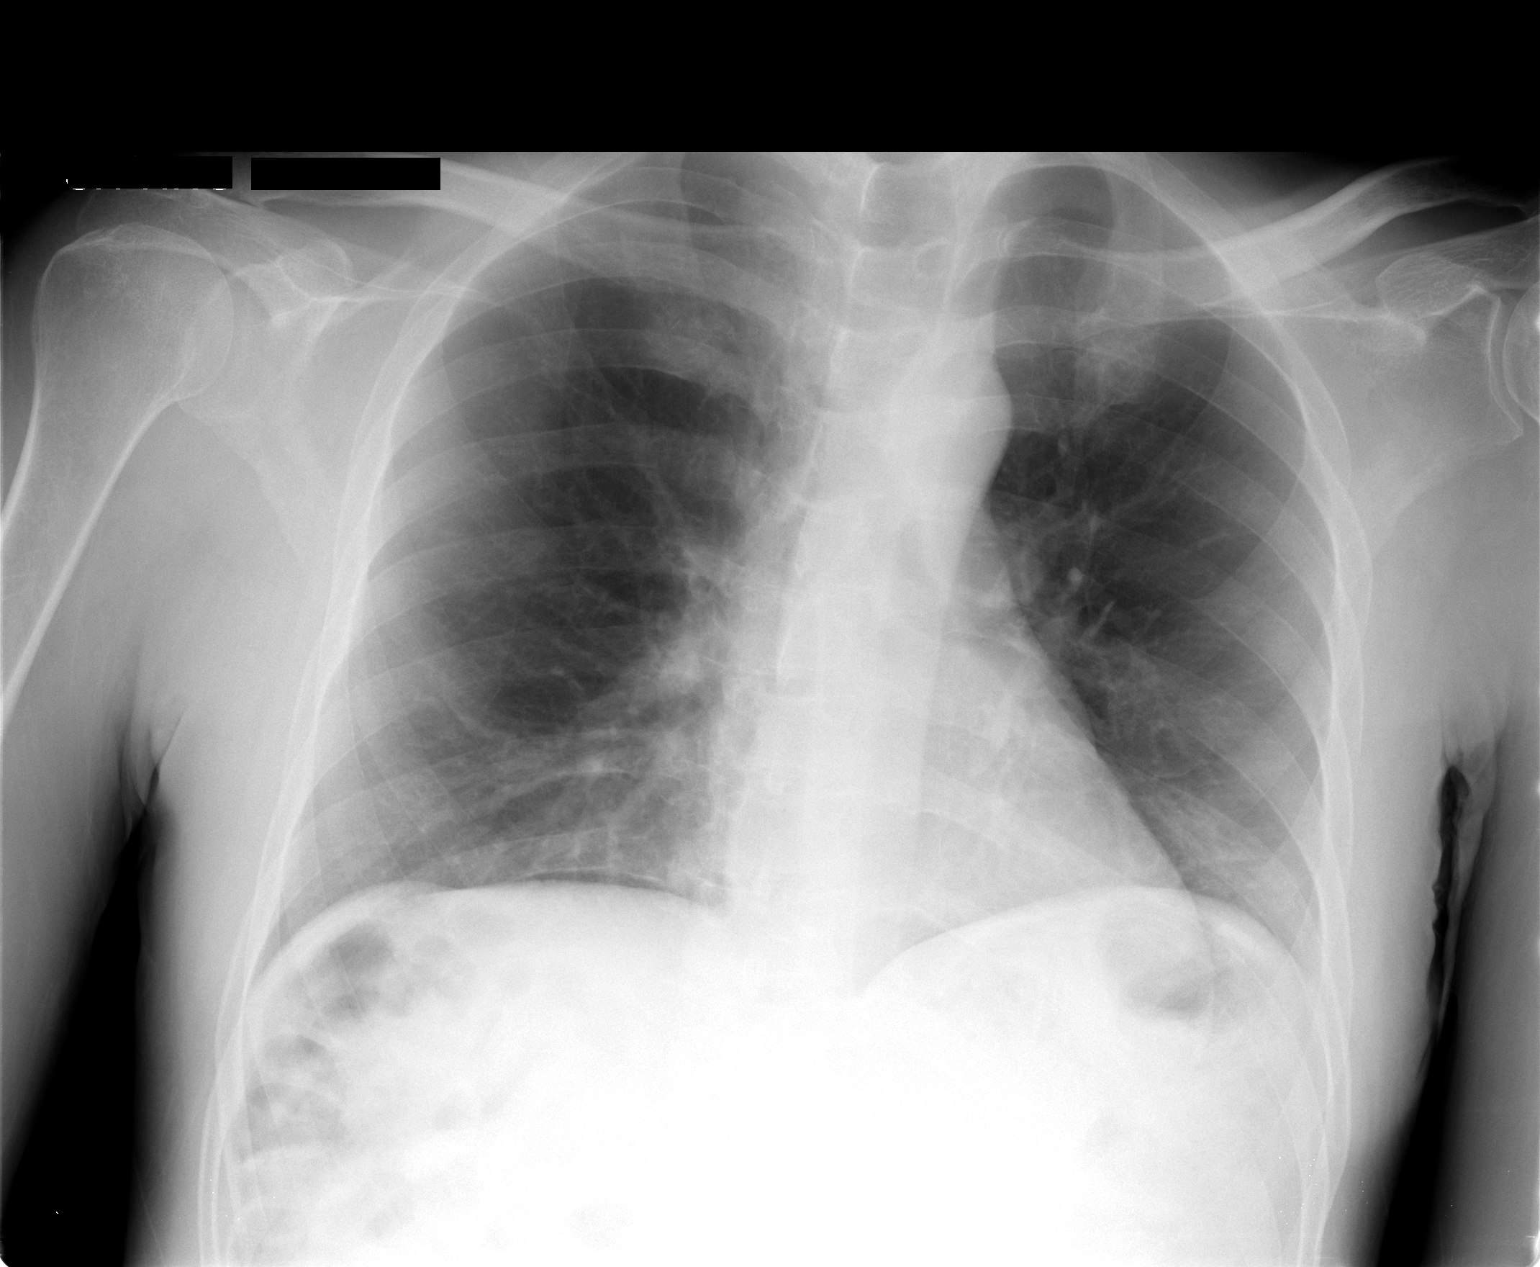

[2 of 2 positions shown; findings below may reference images not displayed]

FINDINGS: AP upright and lateral views were obtained.  Heart and
mediastinal contours within normal limits.  Lungs clear.  No
pleural fluid or osseous lesions.
IMPRESSION: No active disease.

## 2013-02-04 ENCOUNTER — Encounter (INDEPENDENT_AMBULATORY_CARE_PROVIDER_SITE_OTHER): Payer: Self-pay | Admitting: Ophthalmology

## 2015-11-07 ENCOUNTER — Emergency Department (HOSPITAL_COMMUNITY): Payer: Medicare Other

## 2015-11-07 ENCOUNTER — Encounter (HOSPITAL_COMMUNITY): Payer: Self-pay | Admitting: Emergency Medicine

## 2015-11-07 ENCOUNTER — Emergency Department (HOSPITAL_COMMUNITY)
Admission: EM | Admit: 2015-11-07 | Discharge: 2015-11-07 | Disposition: A | Discharge status: Home / Self Care | Payer: Medicare Other | Attending: Emergency Medicine | Admitting: Emergency Medicine

## 2015-11-07 DIAGNOSIS — F259 Schizoaffective disorder, unspecified: Secondary | ICD-10-CM | POA: Insufficient documentation

## 2015-11-07 DIAGNOSIS — D649 Anemia, unspecified: Secondary | ICD-10-CM

## 2015-11-07 DIAGNOSIS — R410 Disorientation, unspecified: Secondary | ICD-10-CM | POA: Diagnosis not present

## 2015-11-07 DIAGNOSIS — Z7984 Long term (current) use of oral hypoglycemic drugs: Secondary | ICD-10-CM | POA: Diagnosis not present

## 2015-11-07 DIAGNOSIS — E119 Type 2 diabetes mellitus without complications: Secondary | ICD-10-CM | POA: Diagnosis not present

## 2015-11-07 DIAGNOSIS — Z79899 Other long term (current) drug therapy: Secondary | ICD-10-CM | POA: Diagnosis not present

## 2015-11-07 DIAGNOSIS — Z0001 Encounter for general adult medical examination with abnormal findings: Secondary | ICD-10-CM | POA: Diagnosis present

## 2015-11-07 DIAGNOSIS — E039 Hypothyroidism, unspecified: Secondary | ICD-10-CM | POA: Insufficient documentation

## 2015-11-07 LAB — CBC WITH DIFFERENTIAL/PLATELET
BASOS PCT: 0 %
Basophils Absolute: 0 10*3/uL (ref 0.0–0.1)
EOS ABS: 0.1 10*3/uL (ref 0.0–0.7)
Eosinophils Relative: 1 %
HCT: 32.5 % — ABNORMAL LOW (ref 39.0–52.0)
Hemoglobin: 10.4 g/dL — ABNORMAL LOW (ref 13.0–17.0)
Lymphocytes Relative: 52 %
Lymphs Abs: 3 10*3/uL (ref 0.7–4.0)
MCH: 29.6 pg (ref 26.0–34.0)
MCHC: 32 g/dL (ref 30.0–36.0)
MCV: 92.6 fL (ref 78.0–100.0)
MONO ABS: 0.4 10*3/uL (ref 0.1–1.0)
MONOS PCT: 8 %
Neutro Abs: 2.2 10*3/uL (ref 1.7–7.7)
Neutrophils Relative %: 39 %
PLATELETS: 160 10*3/uL (ref 150–400)
RBC: 3.51 MIL/uL — ABNORMAL LOW (ref 4.22–5.81)
RDW: 14.5 % (ref 11.5–15.5)
WBC: 5.7 10*3/uL (ref 4.0–10.5)

## 2015-11-07 LAB — VALPROIC ACID LEVEL: Valproic Acid Lvl: 65 ug/mL (ref 50.0–100.0)

## 2015-11-07 LAB — COMPREHENSIVE METABOLIC PANEL
ALBUMIN: 3.7 g/dL (ref 3.5–5.0)
ALT: 12 U/L — ABNORMAL LOW (ref 17–63)
ANION GAP: 5 (ref 5–15)
AST: 16 U/L (ref 15–41)
Alkaline Phosphatase: 42 U/L (ref 38–126)
BILIRUBIN TOTAL: 0.5 mg/dL (ref 0.3–1.2)
BUN: 25 mg/dL — ABNORMAL HIGH (ref 6–20)
CHLORIDE: 106 mmol/L (ref 101–111)
CO2: 31 mmol/L (ref 22–32)
Calcium: 9.7 mg/dL (ref 8.9–10.3)
Creatinine, Ser: 1.04 mg/dL (ref 0.61–1.24)
GFR calc Af Amer: >60 mL/min (ref >60)
GFR calc non Af Amer: >60 mL/min (ref >60)
GLUCOSE: 86 mg/dL (ref 65–99)
POTASSIUM: 4.4 mmol/L (ref 3.5–5.1)
SODIUM: 142 mmol/L (ref 135–145)
TOTAL PROTEIN: 6.2 g/dL — AB (ref 6.5–8.1)

## 2015-11-07 NOTE — Discharge Instructions (Signed)
Anemia, Nonspecific Anemia is a condition in which the concentration of red blood cells or hemoglobin in the blood is below normal. Hemoglobin is a substance in red blood cells that carries oxygen to the tissues of the body. Anemia results in not enough oxygen reaching these tissues.  CAUSES  Common causes of anemia include:   Excessive bleeding. Bleeding may be internal or external. This includes excessive bleeding from periods (in women) or from the intestine.   Poor nutrition.   Chronic kidney, thyroid, and liver disease.  Bone marrow disorders that decrease red blood cell production.  Cancer and treatments for cancer.  HIV, AIDS, and their treatments.  Spleen problems that increase red blood cell destruction.  Blood disorders.  Excess destruction of red blood cells due to infection, medicines, and autoimmune disorders. SIGNS AND SYMPTOMS   Minor weakness.   Dizziness.   Headache.  Palpitations.   Shortness of breath, especially with exercise.   Paleness.  Cold sensitivity.  Indigestion.  Nausea.  Difficulty sleeping.  Difficulty concentrating. Symptoms may occur suddenly or they may develop slowly.  DIAGNOSIS  Additional blood tests are often needed. These help your health care provider determine the best treatment. Your health care provider will check your stool for blood and look for other causes of blood loss.  TREATMENT  Treatment varies depending on the cause of the anemia. Treatment can include:   Supplements of iron, vitamin B12, or folic acid.   Hormone medicines.   A blood transfusion. This may be needed if blood loss is severe.   Hospitalization. This may be needed if there is significant continual blood loss.   Dietary changes.  Spleen removal. HOME CARE INSTRUCTIONS Keep all follow-up appointments. It often takes many weeks to correct anemia, and having your health care provider check on your condition and your response to  treatment is very important. SEEK IMMEDIATE MEDICAL CARE IF:   You develop extreme weakness, shortness of breath, or chest pain.   You become dizzy or have trouble concentrating.  You develop heavy vaginal bleeding.   You develop a rash.   You have bloody or black, tarry stools.   You faint.   You vomit up blood.   You vomit repeatedly.   You have abdominal pain.  You have a fever or persistent symptoms for more than 2-3 days.   You have a fever and your symptoms suddenly get worse.   You are dehydrated.  MAKE SURE YOU:  Understand these instructions.  Will watch your condition.  Will get help right away if you are not doing well or get worse.   This information is not intended to replace advice given to you by your health care provider. Make sure you discuss any questions you have with your health care provider.   Document Released: 06/13/2004 Document Revised: 01/06/2013 Document Reviewed: 10/30/2012 Elsevier Interactive Patient Education 2016 [employer]  

## 2015-11-07 NOTE — ED Provider Notes (Signed)
CSN: [id number]     Arrival date & time 11/07/15  1038 History   First MD Initiated Contact with Patient 11/07/15 1229     Chief Complaint  Patient presents with  . Medical Clearance   HPI The patient presents to the emergency room for a medical evaluation. The history is denied by the patient's caregiver. The patient has a history of schizoaffective disorder and mental retardation.  The patient is normally able to eat and drink, and walk around, but unable to speak. His caregiver states the patient has been acting normally. She has not seen any changes in his behavior. She has no new for a number of years. A caseworker went to check on the patient and noticed that he attempted to drink water from a toilet.  Caregiver states that this is not unusual behavior for the patient. He has to be redirected to not drink water from a toilet.  The patient was evaluated at  day [facility] by a psychiatrist.  The psychiatrist felt like the patient needed to be evaluated for possible organic causes.  Patient has not had any trouble with fevers. He does not seem to be in any pain. He has been eating and drinking normally. He occasionally does vomit but nothing recently. No diarrhea. Past Medical History  Diagnosis Date  . DM (diabetes mellitus) (HCC)   . Schizoaffective disorder   . Mental retardation   . Psychogenic rumination   . Hypothyroidism   . Lichen simplex chronicus    Past Surgical History  Procedure Laterality Date  . Esophagogastroduodenoscopy  2004    Dr. Elmer Ramp   Family History  Problem Relation Age of Onset  . Colon cancer Neg Hx   . Anesthesia problems Neg Hx   . Hypotension Neg Hx   . Malignant hyperthermia Neg Hx   . Pseudochol deficiency Neg Hx    Social History  Substance Use Topics  . Smoking status: Never Smoker   . Smokeless tobacco: None  . Alcohol Use: No    Review of Systems  All other systems reviewed and are negative.     Allergies  Review of patient's  allergies indicates no known allergies.  Home Medications   Prior to Admission medications   Medication Sig Start Date End Date Taking? Authorizing Provider  clobetasol ointment (TEMOVATE) 0.05 % Apply 1 application topically 2 (two) times daily.   Yes Historical Provider, MD  divalproex (DEPAKOTE) 250 MG EC tablet Take 500 mg by mouth 3 (three) times daily.    Yes Historical Provider, MD  divalproex (DEPAKOTE) 500 MG DR tablet Take 500 mg by mouth 2 (two) times daily.   Yes Historical Provider, MD  hydrOXYzine (VISTARIL) 50 MG capsule Take 50 mg by mouth 2 (two) times daily.     Yes Historical Provider, MD  levothyroxine (SYNTHROID, LEVOTHROID) 88 MCG tablet Take 88 mcg by mouth daily.     Yes Historical Provider, MD  LORazepam (ATIVAN) 1 MG tablet Take 1 mg by mouth at bedtime.   Yes Historical Provider, MD  Menthol-Zinc Oxide (LANTISEPTIC MULTI-PURPOSE) 0.45-20 % OINT Apply 1 application topically daily as needed. apply to affected area until healed    Yes Historical Provider, MD  metFORMIN (GLUMETZA) 500 MG (MOD) 24 hr tablet Take 500 mg by mouth 2 (two) times daily with a meal.     Yes Historical Provider, MD  metoCLOPramide (REGLAN) 10 MG tablet Take 10 mg by mouth 3 (three) times daily.     Yes Historical  Provider, MD  Nutritional Supplements (FEEDING SUPPLEMENT, GLUCERNA 1.2 CAL,) LIQD Place 237 mLs into feeding tube continuous.   Yes Historical Provider, MD  OLANZapine (ZYPREXA) 20 MG tablet Take 20 mg by mouth at bedtime.     Yes Historical Provider, MD  omeprazole (PRILOSEC) 20 MG capsule Take 20 mg by mouth daily.     Yes Historical Provider, MD  Skin Protectants, Misc. [facility] SKIN PROTECTANT EX) Apply 1 application topically 2 (two) times daily.   Yes Historical Provider, MD  bisacodyl (DULCOLAX) 5 MG EC tablet Take 2 tablets at 10 am one day before procedure. Take 2 tablets at 3pm. Patient not taking: Reported on 11/07/2015 03/25/11   Nira Retort, NP   BP 132/89 mmHg  Pulse  75  Temp(Src) 98.3 F (36.8 C) (Tympanic)  Resp 16  Ht  (1.88 m)  Wt 69.854 kg  BMI 19.76 kg/m2  SpO2 100% Physical Exam  Constitutional: No distress.  HENT:  Head: Normocephalic and atraumatic.  Right Ear: External ear normal.  Left Ear: External ear normal.  Eyes: Conjunctivae are normal. Right eye exhibits no discharge. Left eye exhibits no discharge. No scleral icterus.  Neck: Neck supple. No tracheal deviation present.  Cardiovascular: Normal rate, regular rhythm and intact distal pulses.   Pulmonary/Chest: Effort normal and breath sounds normal. No stridor. No respiratory distress. He has no wheezes. He has no rales.  Abdominal: Soft. Bowel sounds are normal. He exhibits no distension. There is no tenderness. There is no rebound and no guarding.  Musculoskeletal: He exhibits no edema or tenderness.  Neurological: He is alert. He has normal strength. No cranial nerve deficit (no facial droop, extraocular movements intact) or sensory deficit. He exhibits normal muscle tone. He displays no seizure activity. Coordination abnormal.  Patient is an awake and alert, he groans intermittently in response to questions and when he tries to communicate,  he gestures with his hands asking for food  Skin: Skin is warm and dry. No rash noted. He is not diaphoretic.  Psychiatric: He has a normal mood and affect.  Nursing note and vitals reviewed.   ED Course  Procedures (including critical care time) Labs Review Labs Reviewed  CBC WITH DIFFERENTIAL/PLATELET - Abnormal; Notable for the following:    RBC 3.51 (*)    Hemoglobin 10.4 (*)    HCT 32.5 (*)    All other components within normal limits  COMPREHENSIVE METABOLIC PANEL - Abnormal; Notable for the following:    BUN 25 (*)    Total Protein 6.2 (*)    ALT 12 (*)    All other components within normal limits  VALPROIC ACID LEVEL  URINALYSIS, ROUTINE W REFLEX MICROSCOPIC (NOT AT [facility])    Imaging Review Ct Head Wo  Contrast  11/07/2015  CLINICAL DATA:  Questionable behavior changes. EXAM: CT HEAD WITHOUT CONTRAST TECHNIQUE: Contiguous axial images were obtained from the base of the skull through the vertex without intravenous contrast. COMPARISON:  CT of the head 02/02/2013 FINDINGS: Brain: No evidence of acute infarction, hemorrhage, extra-axial collection, ventriculomegaly, or mass effect. Vascular: No hyperdense vessel or unexpected calcification. Skull: Negative for fracture or focal lesion. Sinuses/Orbits: No acute findings. Other: None. IMPRESSION: No acute intracranial abnormality, accounting for motion artifact. Electronically Signed   By: Ted Mcalpine M.D.   On: 11/07/2015 13:41   I have personally reviewed and evaluated these images and lab results as part of my medical decision-making.    MDM   Final diagnoses:  Chronic  anemia   I reviewed old labs that pt's case worker had on file.  Labs in April showed hgb of 10.8, bun and creatinine of 17 and 0.94 Anemia is stable.  Creatinine is stable.  CT scan without acute findings.  There does not appear to be any acute medical issues to account for behavioral issues.  Pt is safe to follow up with PCP regarding his anemia.  Consider outpatient thyroid studies       Linwood DibblesJon Azalee Weimer, MD 11/07/15 1400

## 2015-11-07 NOTE — ED Notes (Signed)
Patient arrives with group home staff [facility] is concerned about recent behavioral changes at home and has been seen by their psychiatrist and who is concerned about possible organic causes. Patient has been drinking from toilet at home. Staff states patient is acting normal per their observation. States patient likes to eat and drink constantly.

## 2015-11-07 NOTE — ED Notes (Signed)
Patient eating lunch tray with visitor at bedside. Pleasant and coroperative.

## 2017-06-25 IMAGING — CT CT HEAD W/O CM
3 of 6 series · 14 of 47 positions shown, 16 images · non-contrast
Comparison: CT of the head 02/02/2013

CLINICAL DATA: Questionable behavior changes.

EXAM:
CT HEAD WITHOUT CONTRAST
TECHNIQUE: Contiguous axial images were obtained from the base of the skull
through the vertex without intravenous contrast.

[Series 2: head w/o · axial · non-contrast · 0.41mm/px · z∈[+143,+267]mm · 8 of 41 slices shown, 10 images (1 of 2)]
[im 5/41  brain]
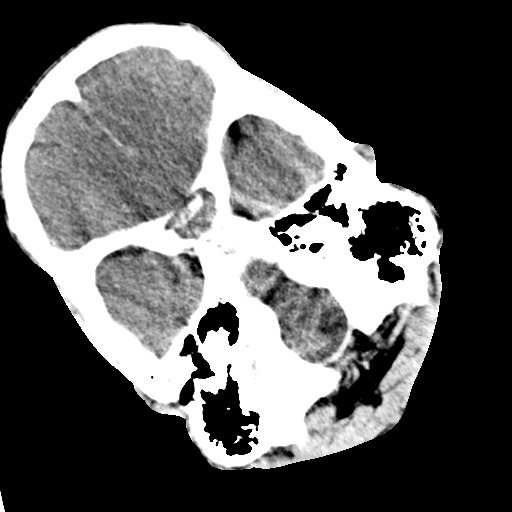
[im 5/41  bone]
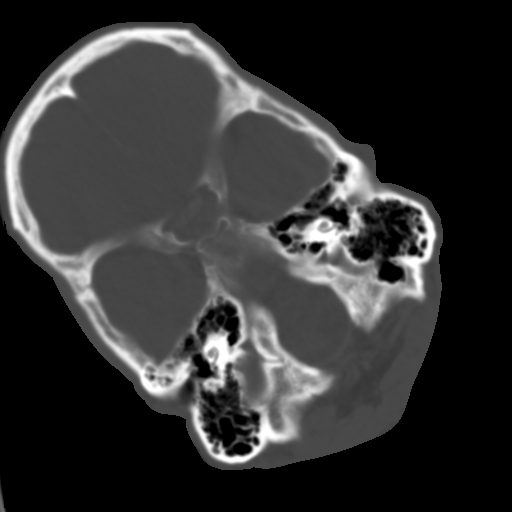
[im 9/41  brain]
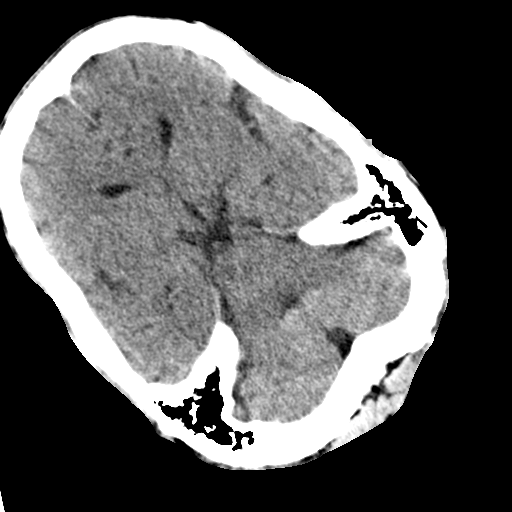
[im 14/41  brain]
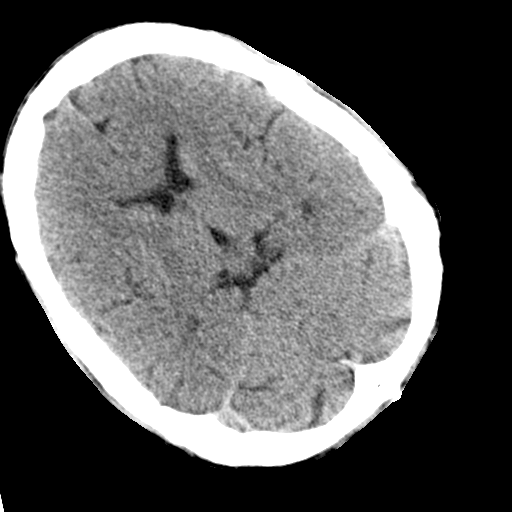
[im 18/41  brain]
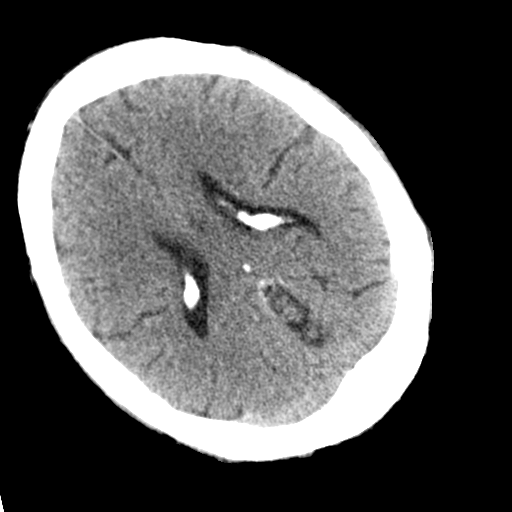
[im 23/41  brain]
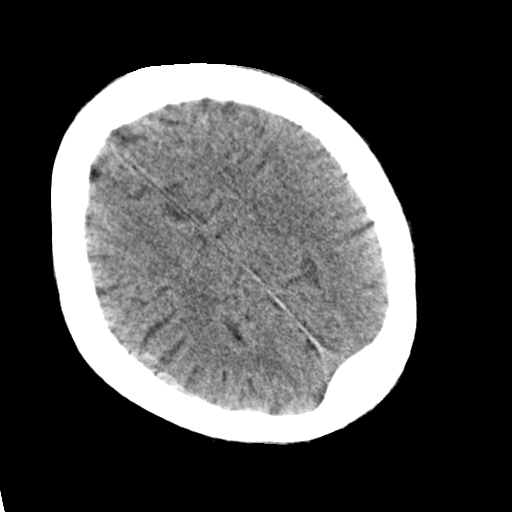
[im 23/41  bone]
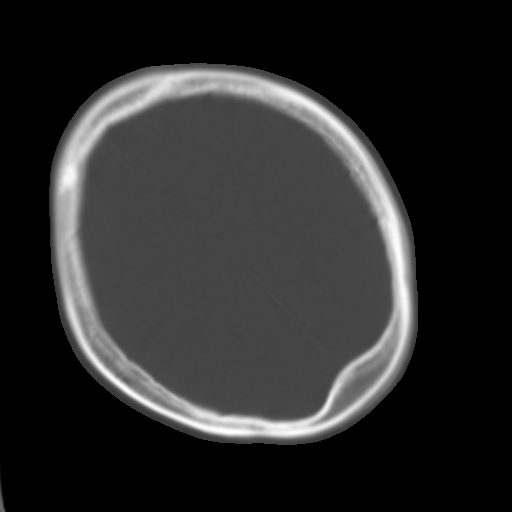
[im 27/41  brain]
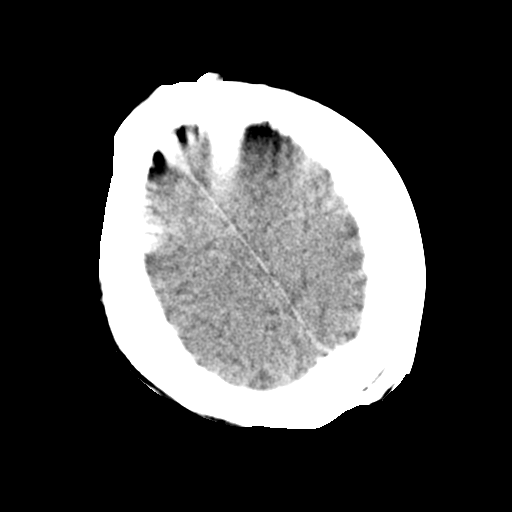
[im 32/41  brain]
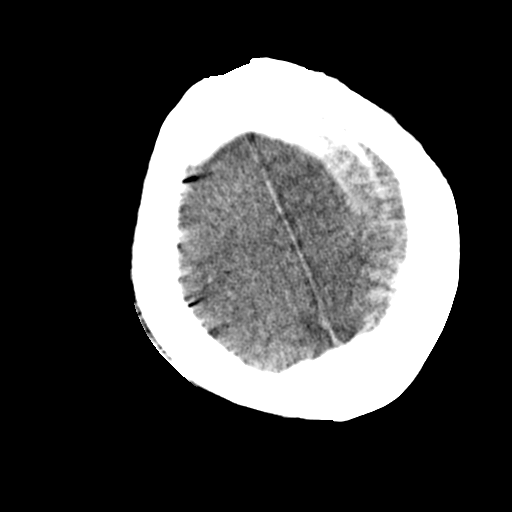
[im 36/41  brain]
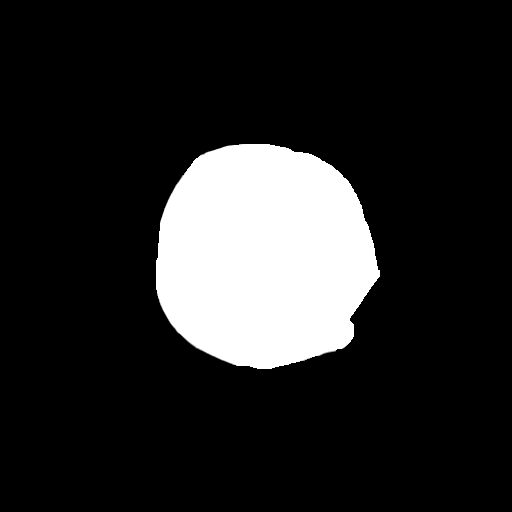

[Series 3: head w/o · axial · non-contrast · 0.41mm/px · z∈[+228,+268]mm · 3 of 20 slices shown (2 of 2)]
[im 5/20  brain]
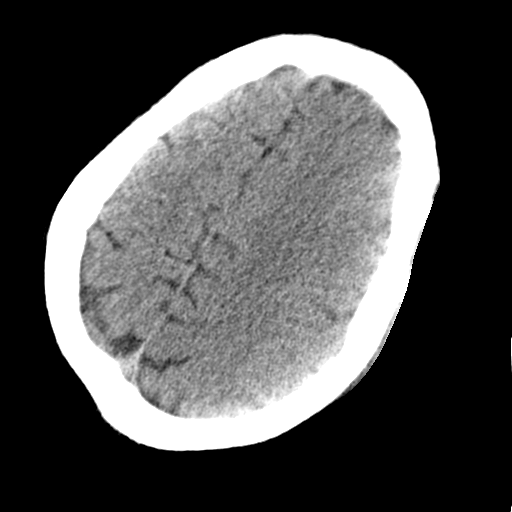
[im 10/20  brain]
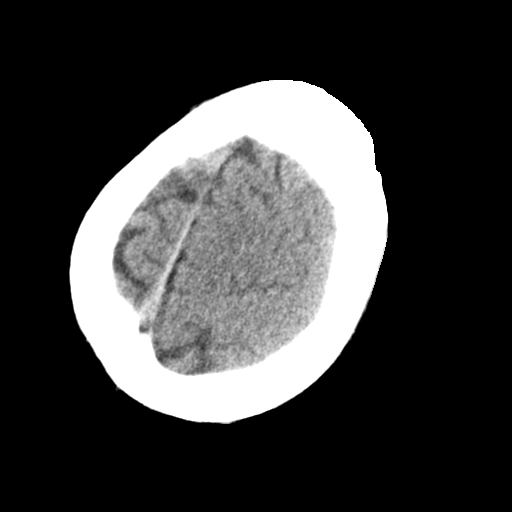
[im 15/20  brain]
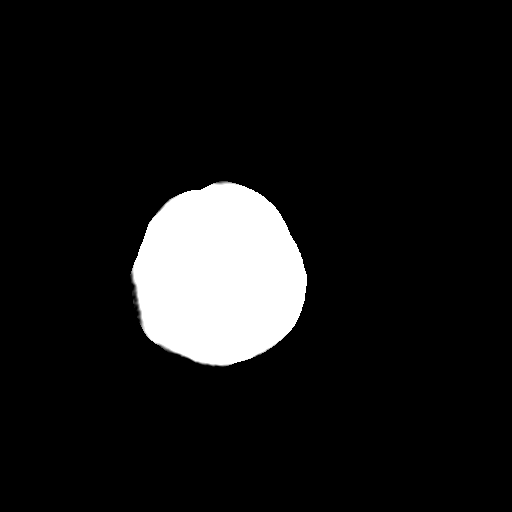

[Series 7: coronal · coronal · 0.15mm/px · 3 of 65 slices shown]
[im 22/65  brain]
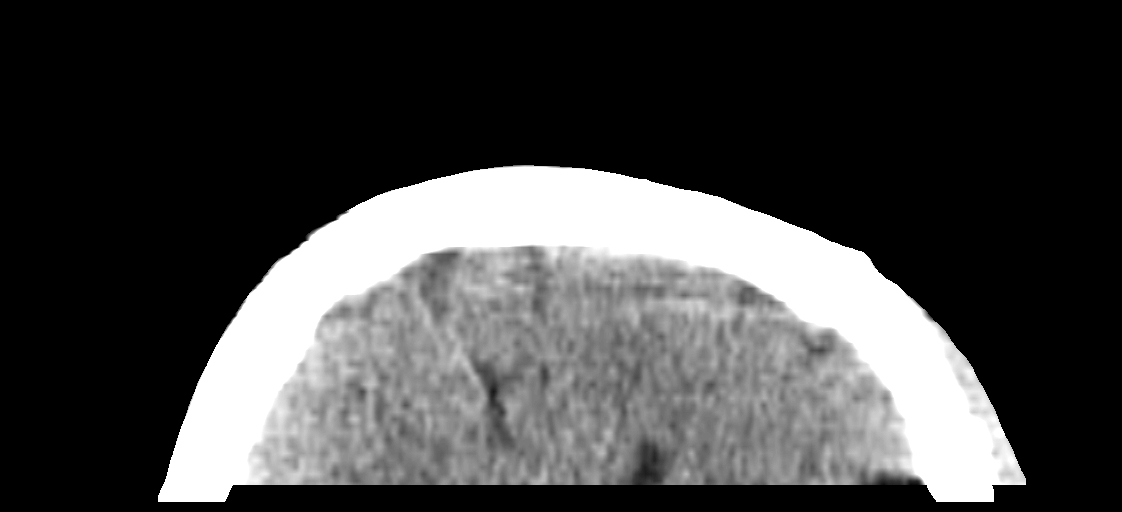
[im 29/65  brain]
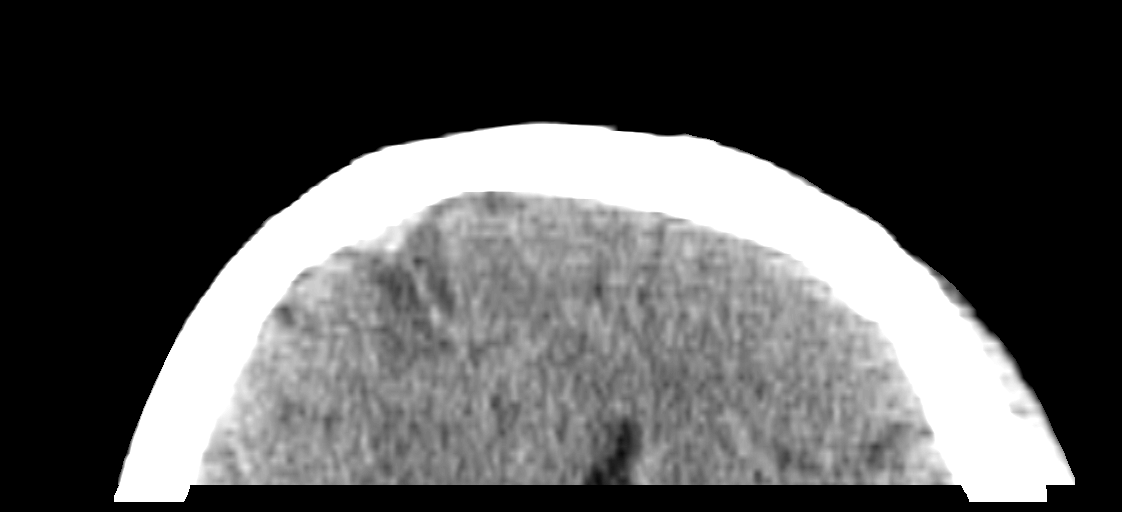
[im 36/65  brain]
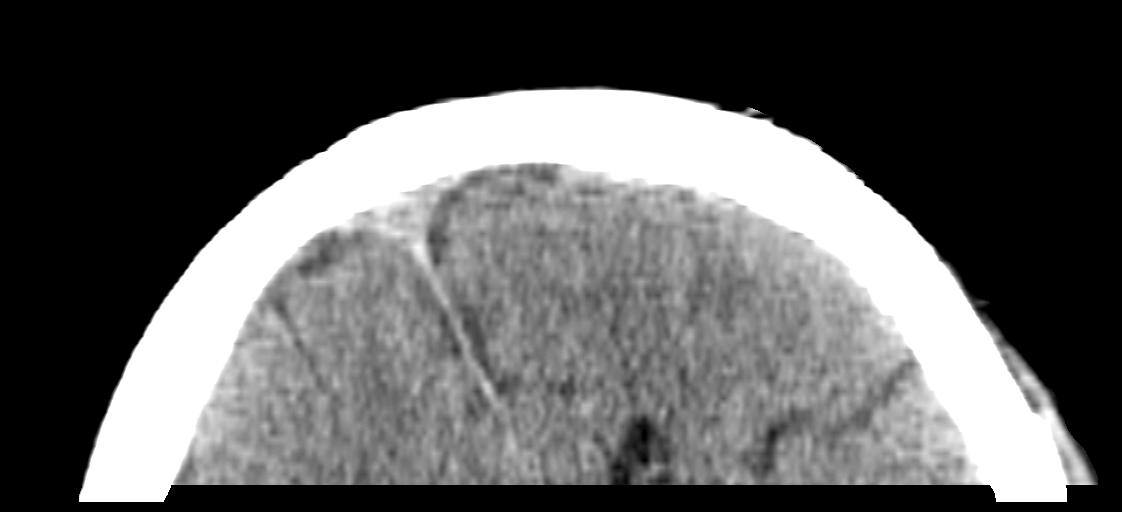

[14 of 47 positions shown; findings below may reference images not displayed]

FINDINGS: Brain: No evidence of acute infarction, hemorrhage, extra-axial
collection, ventriculomegaly, or mass effect.

Vascular: No hyperdense vessel or unexpected calcification.

Skull: Negative for fracture or focal lesion.

Sinuses/Orbits: No acute findings.

Other: None.
IMPRESSION: No acute intracranial abnormality, accounting for motion artifact.

## 2019-09-17 ENCOUNTER — Ambulatory Visit: Admit: 2019-09-17 | Payer: Medicare Other | Admitting: Ophthalmology

## 2019-09-17 SURGERY — PHACOEMULSIFICATION, CATARACT, WITH IOL INSERTION
Anesthesia: General | Laterality: Right

## 2023-09-12 ENCOUNTER — Ambulatory Visit: Payer: Self-pay | Admitting: Family Medicine

## 2023-09-16 ENCOUNTER — Encounter: Payer: Self-pay | Admitting: Family Medicine
